# Patient Record
Sex: Male | Born: 1980 | Hispanic: No | Marital: Married | Smoking: Never smoker
Health system: Southern US, Community
[De-identification: ages and names within clinical notes are randomized; demographics above are authoritative.]

---

## 2020-04-04 DIAGNOSIS — Z111 Encounter for screening for respiratory tuberculosis: Secondary | ICD-10-CM | POA: Diagnosis not present

## 2020-04-04 DIAGNOSIS — Z23 Encounter for immunization: Secondary | ICD-10-CM | POA: Diagnosis not present

## 2020-05-05 DIAGNOSIS — Z23 Encounter for immunization: Secondary | ICD-10-CM | POA: Diagnosis not present

## 2020-05-19 DIAGNOSIS — K219 Gastro-esophageal reflux disease without esophagitis: Secondary | ICD-10-CM | POA: Diagnosis not present

## 2020-09-13 DIAGNOSIS — J209 Acute bronchitis, unspecified: Secondary | ICD-10-CM | POA: Diagnosis not present

## 2020-09-22 DIAGNOSIS — F439 Reaction to severe stress, unspecified: Secondary | ICD-10-CM | POA: Diagnosis not present

## 2020-10-20 DIAGNOSIS — F439 Reaction to severe stress, unspecified: Secondary | ICD-10-CM | POA: Diagnosis not present

## 2020-11-10 DIAGNOSIS — F439 Reaction to severe stress, unspecified: Secondary | ICD-10-CM | POA: Diagnosis not present

## 2020-11-27 DIAGNOSIS — Z23 Encounter for immunization: Secondary | ICD-10-CM | POA: Diagnosis not present

## 2020-12-08 DIAGNOSIS — M79671 Pain in right foot: Secondary | ICD-10-CM | POA: Diagnosis not present

## 2020-12-08 DIAGNOSIS — M79672 Pain in left foot: Secondary | ICD-10-CM | POA: Diagnosis not present

## 2020-12-22 DIAGNOSIS — F439 Reaction to severe stress, unspecified: Secondary | ICD-10-CM | POA: Diagnosis not present

## 2021-01-24 DIAGNOSIS — R221 Localized swelling, mass and lump, neck: Secondary | ICD-10-CM | POA: Diagnosis not present

## 2021-01-24 DIAGNOSIS — R946 Abnormal results of thyroid function studies: Secondary | ICD-10-CM | POA: Diagnosis not present

## 2021-01-24 DIAGNOSIS — F439 Reaction to severe stress, unspecified: Secondary | ICD-10-CM | POA: Diagnosis not present

## 2021-01-25 ENCOUNTER — Other Ambulatory Visit: Payer: Self-pay | Admitting: Adult Health Nurse Practitioner

## 2021-01-25 DIAGNOSIS — R221 Localized swelling, mass and lump, neck: Secondary | ICD-10-CM

## 2021-02-06 ENCOUNTER — Other Ambulatory Visit: Payer: Self-pay

## 2021-02-07 ENCOUNTER — Ambulatory Visit
Admission: RE | Admit: 2021-02-07 | Discharge: 2021-02-07 | Disposition: A | Discharge status: Home / Self Care | Payer: BC Managed Care – PPO | Source: Ambulatory Visit | Attending: Adult Health Nurse Practitioner | Admitting: Adult Health Nurse Practitioner

## 2021-02-07 DIAGNOSIS — E041 Nontoxic single thyroid nodule: Secondary | ICD-10-CM | POA: Diagnosis not present

## 2021-02-07 DIAGNOSIS — R221 Localized swelling, mass and lump, neck: Secondary | ICD-10-CM

## 2021-02-14 DIAGNOSIS — F439 Reaction to severe stress, unspecified: Secondary | ICD-10-CM | POA: Diagnosis not present

## 2021-02-23 ENCOUNTER — Encounter: Payer: Self-pay | Admitting: Internal Medicine

## 2021-02-23 ENCOUNTER — Ambulatory Visit: Payer: BC Managed Care – PPO | Admitting: Internal Medicine

## 2021-02-23 ENCOUNTER — Other Ambulatory Visit: Payer: Self-pay

## 2021-02-23 VITALS — BP 120/72 | HR 64 | Ht 66.0 in | Wt 148.0 lb

## 2021-02-23 DIAGNOSIS — E042 Nontoxic multinodular goiter: Secondary | ICD-10-CM | POA: Diagnosis not present

## 2021-02-23 DIAGNOSIS — E0590 Thyrotoxicosis, unspecified without thyrotoxic crisis or storm: Secondary | ICD-10-CM

## 2021-02-23 LAB — TSH: TSH: 0.38 u[IU]/mL (ref 0.35–5.50)

## 2021-02-23 LAB — T4, FREE: Free T4: 0.91 ng/dL (ref 0.60–1.60)

## 2021-02-23 NOTE — Progress Notes (Signed)
Name: Dustine Harrell  MRN/ DOB: [id number], 10/17/1980    Age/ Sex: 40 y.o., male    PCP: Pcp, No   Reason for Endocrinology Evaluation: MNG/subclinical hyperthyroidism     Date of Initial Endocrinology Evaluation: 02/23/2021     HPI: Mr. Maurice Harrell is a 40 y.o. male with a past medical history of MNG. The patient presented for initial endocrinology clinic visit on 02/23/2021 for consultative assistance with his MNG.   He presented to her PCP in 01/2021 with right neck mass, this prompted a thyroid ultrasound revealing right superior nodule meeting FNA criteria at 3.3x2.5x1.9 cm nodule and a left superior 1.3x1.1x0.6 cm nodule.    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 No pertinent past medical history. Past Surgical History: History reviewed. No pertinent surgical history.  Social History:  reports that he has never smoked. He has never used smokeless tobacco. He reports that he does not drink alcohol and does not use drugs. Family History: family history includes Hypertension in his mother.   HOME MEDICATIONS: Allergies as of 02/23/2021   No Known Allergies      Medication List        Accurate as of February 23, 2021 12:05 PM. If you have any questions, ask your nurse or doctor.          cetirizine 10 MG tablet Commonly known as: ZYRTEC Take 10 mg by mouth daily as needed.          REVIEW OF SYSTEMS: A comprehensive ROS was conducted with the patient and is negative except as per HPI    OBJECTIVE:  VS: BP 120/72   Pulse 64   Ht 5\' 6"  (1.676 m)   Wt 148 lb (67.1 kg)    SpO2 99%   BMI 23.89 kg/m    Wt Readings from Last 3 Encounters:  02/23/21 148 lb (67.1 kg)     EXAM: General: Pt appears well and is in NAD  Eyes: External eye exam normal without stare, lid lag or exophthalmos.    Neck: General: Supple without adenopathy. Thyroid: Thyroid size normal.  No goiter or nodules appreciated.   Lungs: Clear with good BS bilat with no rales, rhonchi, or wheezes  Heart: Auscultation: RRR.  Abdomen: Normoactive bowel sounds, soft, nontender, without masses or organomegaly palpable  Extremities:  BL LE: No pretibial edema normal ROM and strength.  Skin: Hair: Texture and amount normal with gender appropriate distribution Skin Inspection: No rashes Skin Palpation: Skin temperature, texture, and thickness normal to palpation  Neuro: Cranial nerves: II - XII grossly intact  Motor: Normal strength throughout DTRs: 2+ and symmetric in UE without delay in relaxation phase  Mental Status: Judgment, insight: Intact Orientation: Oriented to time, place, and person Mood and affect: No depression, anxiety, or agitation     DATA REVIEWED:  Results for Maurice, Harrell (MRN Rayburn Felt) as of 02/23/2021 15:16  Ref. Range 02/23/2021 12:12  TSH Latest Ref Range: 0.35 - 5.50 uIU/mL 0.38  T4,Free(Direct) Latest Ref Range: 0.60 -  1.60 ng/dL 5.46      Thyroid Ultrasound 02/07/2021    Estimated total number of nodules >/= 1 cm: 2   Number of spongiform nodules >/=  2 cm not described below (TR1): 0   Number of mixed cystic and solid nodules >/= 1.5 cm not described below (TR2): 0   _________________________________________________________   Nodule # 1:   Location: Right; Superior   Maximum size: 3.3 cm; Other 2 dimensions: 2.5 x 1.9 cm   Composition: solid/almost completely solid (2)   Echogenicity: hypoechoic (2)   Shape: not taller-than-wide (0)   Margins: smooth (0)   Echogenic foci: none (0)   ACR TI-RADS total points: 4.   ACR TI-RADS risk  category: TR4 (4-6 points).   ACR TI-RADS recommendations:   **Given size (>/= 1.5 cm) and appearance, fine needle aspiration of this moderately suspicious nodule should be considered based on TI-RADS criteria.   _________________________________________________________   Nodule # 2:   Location: Left; Superior   Maximum size: 1.3 cm; Other 2 dimensions: 1.1 x 0.6 cm   Composition: solid/almost completely solid (2)   Echogenicity: isoechoic (1)   Shape: not taller-than-wide (0)   Margins: smooth (0)   Echogenic foci: none (0)   ACR TI-RADS total points: 3.   ACR TI-RADS risk category: TR3 (3 points).   ACR TI-RADS recommendations:   Given size (<1.4 cm) and appearance, this nodule does NOT meet TI-RADS criteria for biopsy or dedicated follow-up.   _________________________________________________________   IMPRESSION: Dominant right superior solid thyroid nodule (labeled 1, 3.3 cm) meets criteria (TI-RADS category 4) for tissue sampling. Recommend ultrasound-guided fine-needle aspiration.    ASSESSMENT/PLAN/RECOMMENDATIONS:   MNG:  - He has not noted any increase in size in the past month - We discussed thyroid nodules carry a cancer risk of 3-5 % , will proceed with FNA of the right superior nodule   2. Subclinical Hyperthyroidism   - Pt is clinically euthyroid  -  Repeat TFT's are normal   F/U in 6 months     Signed electronically by: Lyndle Herrlich, MD  [facility] Endocrinology  [facility] [address] [place], [place] [address] Phone: [phone] FAX: [phone]   CC: Pcp, No No address on file Phone: None Fax: None   Return to Endocrinology clinic as below: No future appointments.

## 2021-02-25 LAB — T3: T3, Total: 132 ng/dL (ref 76–181)

## 2021-02-25 LAB — TRAB (TSH RECEPTOR BINDING ANTIBODY): TRAB: <1 IU/L (ref <=2.00)

## 2021-03-13 ENCOUNTER — Other Ambulatory Visit: Payer: Self-pay

## 2021-03-13 ENCOUNTER — Ambulatory Visit
Admission: RE | Admit: 2021-03-13 | Discharge: 2021-03-13 | Disposition: A | Discharge status: Home / Self Care | Payer: BC Managed Care – PPO | Source: Ambulatory Visit | Attending: Internal Medicine | Admitting: Internal Medicine

## 2021-03-13 ENCOUNTER — Other Ambulatory Visit (HOSPITAL_COMMUNITY)
Admission: RE | Admit: 2021-03-13 | Discharge: 2021-03-13 | Disposition: A | Discharge status: Home / Self Care | Payer: BC Managed Care – PPO | Source: Ambulatory Visit | Attending: Student | Admitting: Student

## 2021-03-13 DIAGNOSIS — E042 Nontoxic multinodular goiter: Secondary | ICD-10-CM

## 2021-03-13 DIAGNOSIS — E041 Nontoxic single thyroid nodule: Secondary | ICD-10-CM | POA: Diagnosis not present

## 2021-03-15 LAB — CYTOLOGY - NON PAP

## 2021-03-19 ENCOUNTER — Telehealth: Payer: Self-pay | Admitting: Internal Medicine

## 2021-03-19 ENCOUNTER — Encounter: Payer: Self-pay | Admitting: Internal Medicine

## 2021-03-19 DIAGNOSIS — E042 Nontoxic multinodular goiter: Secondary | ICD-10-CM

## 2021-03-19 NOTE — Telephone Encounter (Signed)
Discussed inconclusive FNA results of the right thyroid nodule due to scant cellularity     Pt in agreement to proceed with another FNA in 3 months

## 2021-06-27 ENCOUNTER — Ambulatory Visit: Payer: BC Managed Care – PPO | Admitting: Internal Medicine

## 2021-06-27 NOTE — Progress Notes (Deleted)
Name: Maurice Harrell  MRN/ DOB: [id number], 11/15/1980    Age/ Sex: 40 y.o., male     PCP: Pcp, No   Reason for Endocrinology Evaluation: MNG     Initial Endocrinology Clinic Visit: 02/23/2021    PATIENT IDENTIFIER: Maurice Harrell is a 40 y.o., male with a past medical history of MNG. He has followed with [facility] since 02/23/2021 for consultative assistance with management of his MNG.   HISTORICAL SUMMARY:   He presented to her PCP in 01/2021 with right neck mass, this prompted a thyroid ultrasound revealing right superior nodule meeting FNA criteria at 3.3x2.5x1.9 cm nodule and a left superior 1.3x1.1x0.6 cm nodule.      Of note, he was noted to have a low TSh at 0.26 uIU/mL with normal total T4 at 8.8 ( reference 4.9-10.5), but this has normalized .    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    HISTORY:  Past Medical History: No past medical history on file. Past Surgical History: No past surgical history on file. Social History:  reports that he has never smoked. He has never used smokeless tobacco. He reports that he does not drink alcohol and does not use drugs. Family History:  Family History  Problem Relation Age of Onset   Hypertension Mother      HOME MEDICATIONS: Allergies as of 06/27/2021   No Known Allergies      Medication List        Accurate as of June 27, 2021  7:16 AM. If you have any questions, ask your nurse or doctor.          cetirizine 10 MG tablet Commonly known as: ZYRTEC Take 10 mg by mouth daily as needed.          OBJECTIVE:   PHYSICAL EXAM: VS: There were no vitals taken for this visit.   EXAM: General: Pt appears well and is in NAD  Hydration: Well-hydrated with moist mucous membranes and good skin turgor  Eyes: External eye exam normal without stare, lid  lag or exophthalmos.  EOM intact.  PERRL.  Ears, Nose, Throat: Hearing: Grossly intact bilaterally Dental: Good dentition  Throat: Clear without mass, erythema or exudate  Neck: General: Supple without adenopathy. Thyroid: Thyroid size normal.  No goiter or nodules appreciated. No thyroid bruit.  Lungs: Clear with good BS bilat with no rales, rhonchi, or wheezes  Heart: Auscultation: RRR.  Abdomen: Normoactive bowel sounds, soft, nontender, without masses or organomegaly palpable  Extremities: Gait and station: Normal gait  Digits and nails: No clubbing, cyanosis, petechiae, or nodes Head and neck: Normal alignment and mobility BL UE: Normal ROM and strength. BL LE: No pretibial edema normal ROM and strength.  Mental Status: Judgment, insight: Intact Orientation: Oriented to time, place, and person Memory: Intact for recent and remote events Mood and affect: No depression, anxiety, or agitation     DATA REVIEWED: ***  FNA 03/13/2021   Clinical History: Right Superior, 3.3 cm; Other 2 dimensions: 2.5 x 1.9  cm, Solid/almost completely solid, Hypoechoic, TI-RADS total points:4  Specimen Submitted:  A. THYROID, RUP, FINE NEEDLE ASPIRATION:    FINAL MICROSCOPIC DIAGNOSIS:  - Scant follicular epithelium present (Bethesda category I)   ASSESSMENT / PLAN / RECOMMENDATIONS:   MNG:   Plan: ***  Medications   ***   Signed electronically by: Lyndle Herrlich, MD  [facility] Endocrinology  [facility] [address] [place], [place] [address] Phone: [phone] FAX: [phone]      CC: Pcp, No No address on file Phone: None  Fax: None   Return to Endocrinology clinic as below: Future Appointments  Date Time Provider Department Center  06/27/2021  9:10 AM Mauriana Dann, Konrad Dolores, MD [id number] None

## 2021-12-10 IMAGING — US US THYROID
1 series · 13 of 25 positions shown · non-contrast
Comparison: None.

CLINICAL DATA: Palpable abnormality.

EXAM:
THYROID ULTRASOUND
TECHNIQUE: Ultrasound examination of the thyroid gland and adjacent soft
tissues was performed.

[Series 1: us thyroid · 0.07mm/px · 13 of 70 slices shown]
[im 1/70]
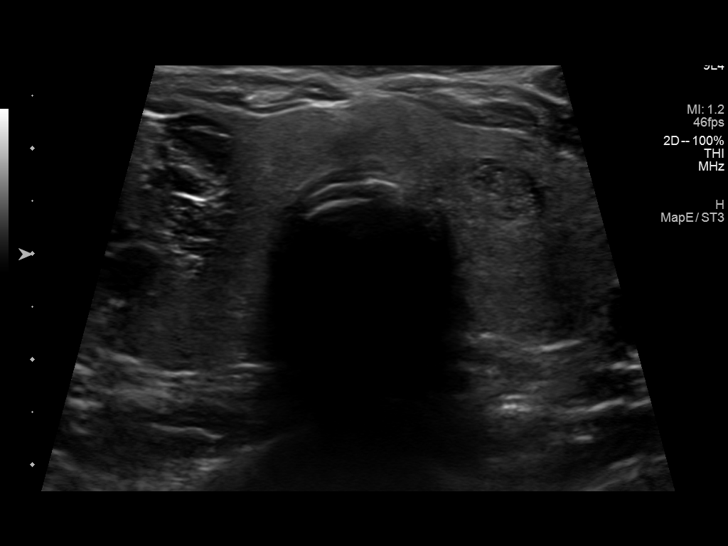
[im 6/70]
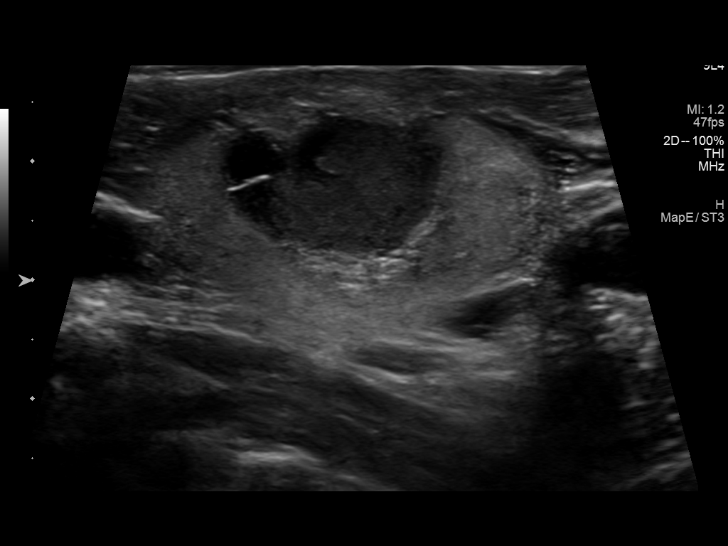
[im 12/70]
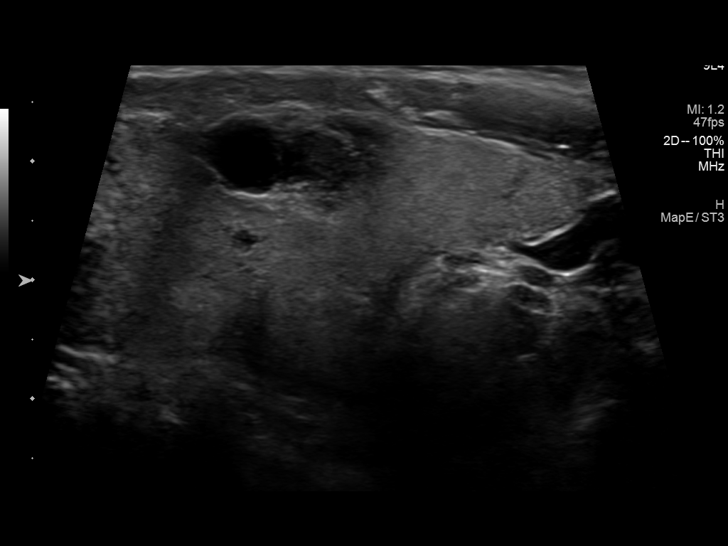
[im 18/70]
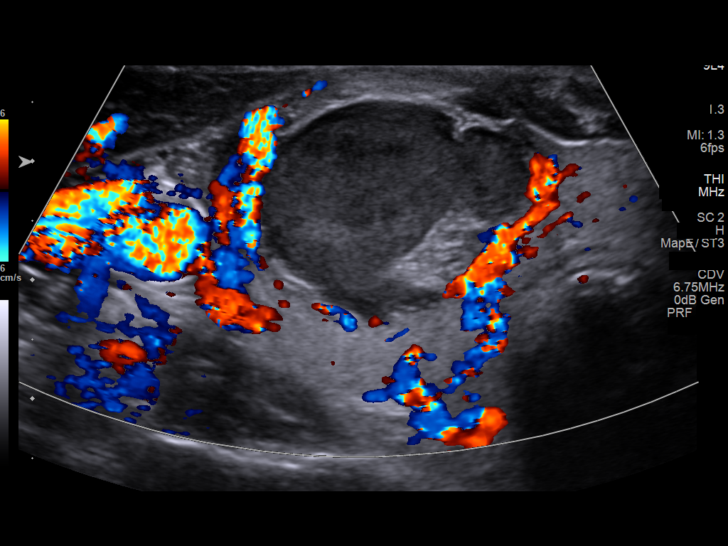
[im 24/70]
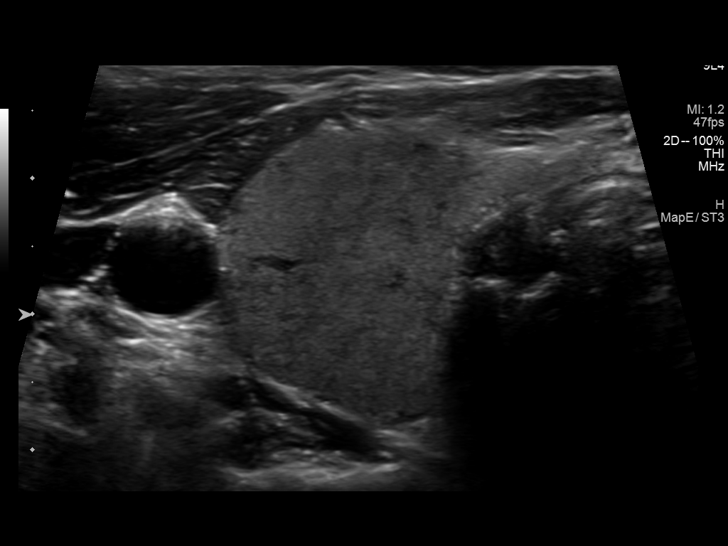
[im 29/70]
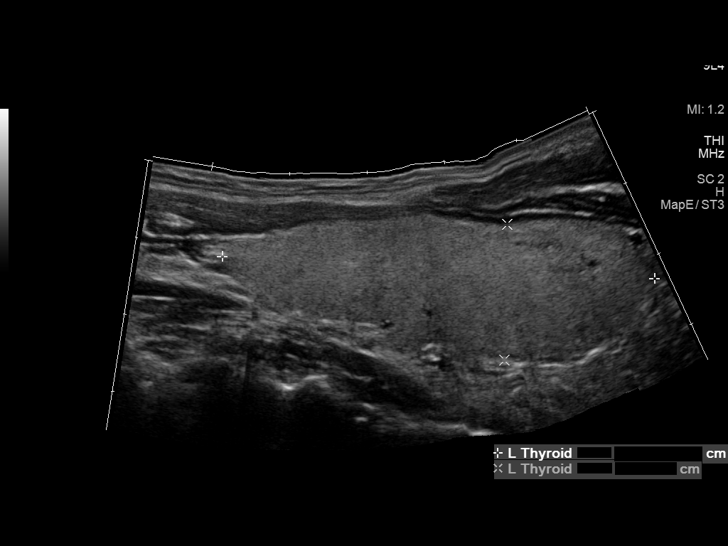
[im 35/70]
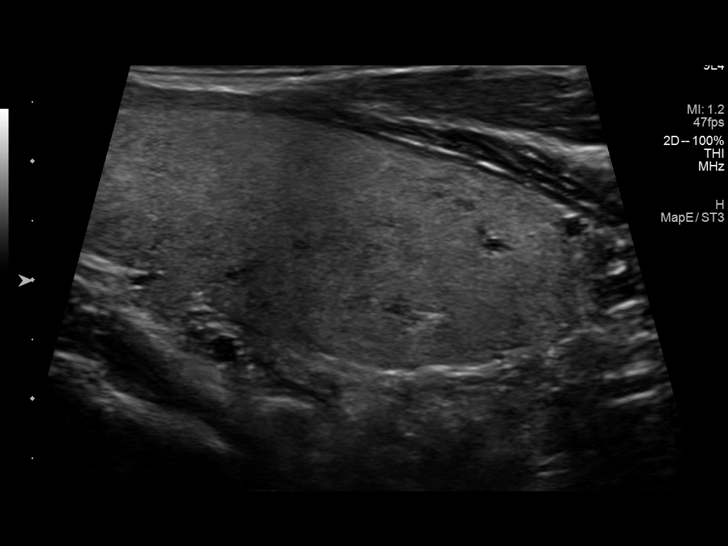
[im 41/70]
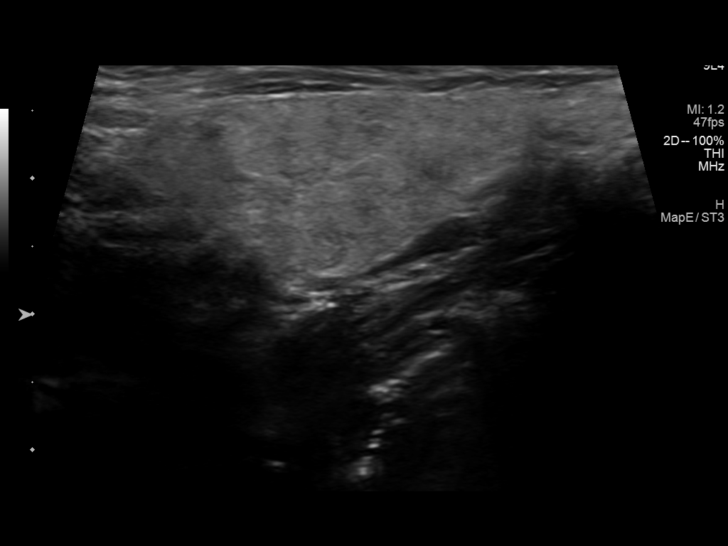
[im 47/70]
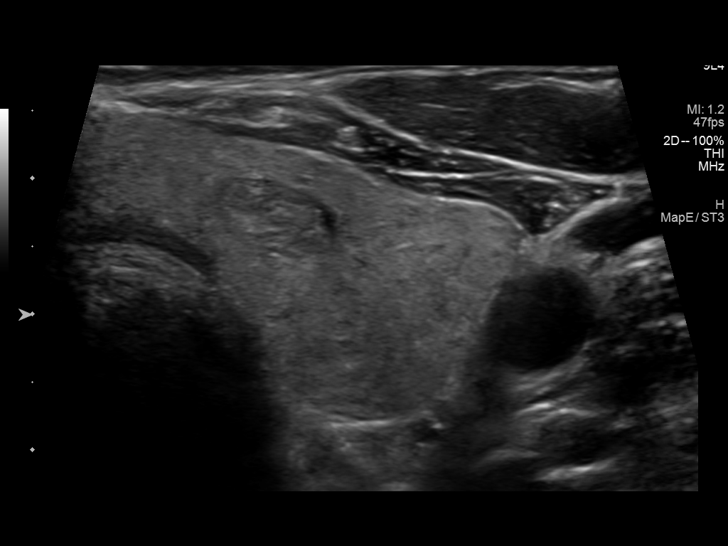
[im 52/70]
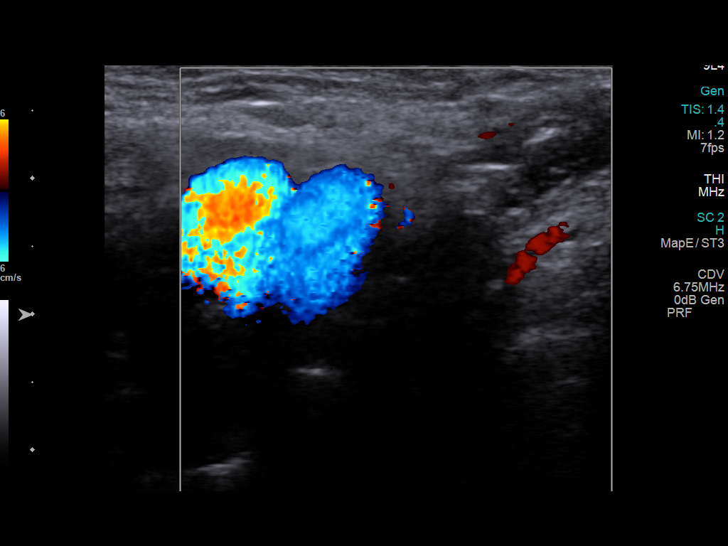
[im 58/70]
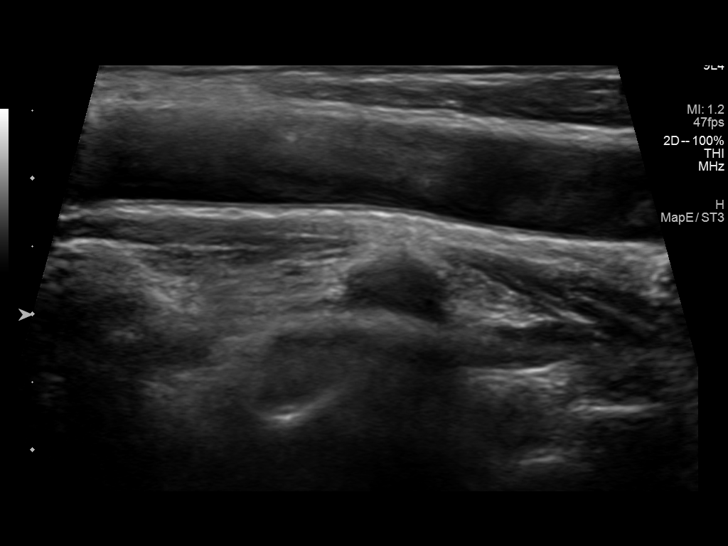
[im 64/70]
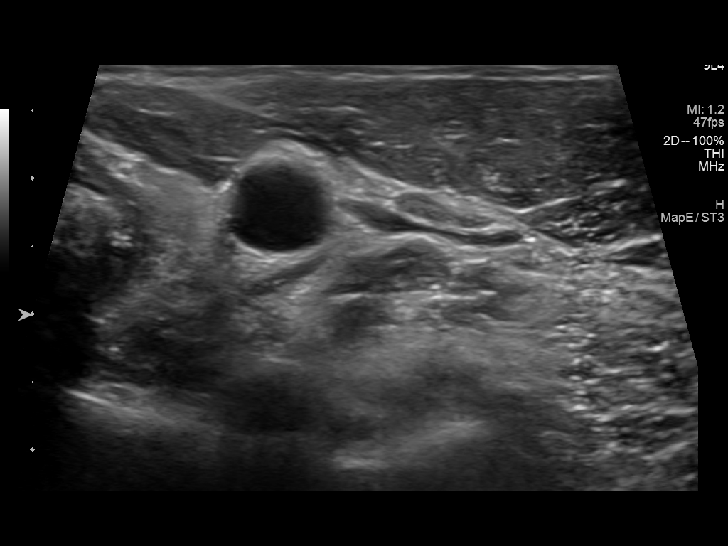
[im 70/70]
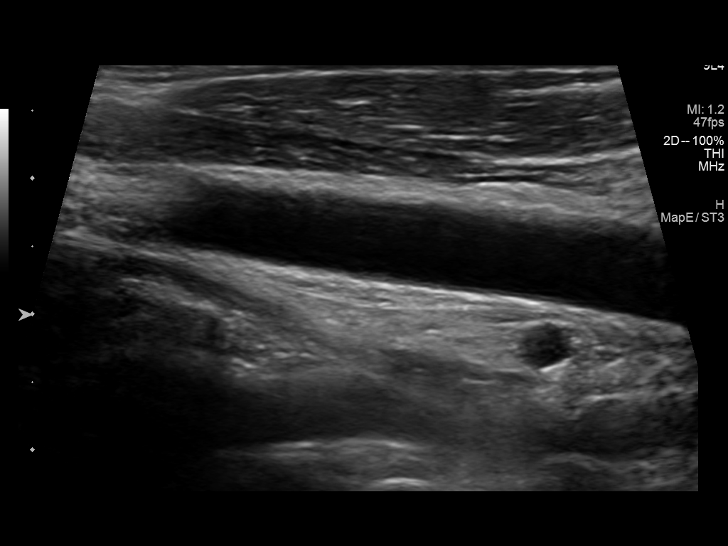

[13 of 25 positions shown; findings below may reference images not displayed]

FINDINGS: Parenchymal Echotexture: Mildly heterogenous

Isthmus: 0.7 cm

Right lobe: 6.4 x 2.7 x 3.2 cm

Left lobe: 5.6 x 1.7 x 2.1 cm

_________________________________________________________

Estimated total number of nodules >/= 1 cm: 2

Number of spongiform nodules >/=  2 cm not described below (TR1): 0

Number of mixed cystic and solid nodules >/= 1.5 cm not described
below (TR2): 0

_________________________________________________________

Nodule # 1:

Location: Right; Superior

Maximum size: 3.3 cm; Other 2 dimensions: 2.5 x 1.9 cm

Composition: solid/almost completely solid (2)

Echogenicity: hypoechoic (2)

Shape: not taller-than-wide (0)

Margins: smooth (0)

Echogenic foci: none (0)

ACR TI-RADS total points: 4.

ACR TI-RADS risk category: TR4 (4-6 points).

ACR TI-RADS recommendations:

**Given size (>/= 1.5 cm) and appearance, fine needle aspiration of
this moderately suspicious nodule should be considered based on
TI-RADS criteria.

_________________________________________________________

Nodule # 2:

Location: Left; Superior

Maximum size: 1.3 cm; Other 2 dimensions: 1.1 x 0.6 cm

Composition: solid/almost completely solid (2)

Echogenicity: isoechoic (1)

Shape: not taller-than-wide (0)

Margins: smooth (0)

Echogenic foci: none (0)

ACR TI-RADS total points: 3.

ACR TI-RADS risk category: TR3 (3 points).

ACR TI-RADS recommendations:

Given size (<1.4 cm) and appearance, this nodule does NOT meet
TI-RADS criteria for biopsy or dedicated follow-up.

_________________________________________________________
IMPRESSION: Dominant right superior solid thyroid nodule (labeled 1, 3.3 cm)
meets criteria (TI-RADS category 4) for tissue sampling. Recommend
ultrasound-guided fine-needle aspiration.

The above is in keeping with the ACR TI-RADS recommendations - [HOSPITAL] 6001;[DATE].

## 2022-01-13 IMAGING — US US FNA BIOPSY THYROID 1ST LESION
1 series · 12 of 12 positions shown · non-contrast
Comparison: Thyroid US on 02/07/2021

MEDICATIONS:
3 ML 1 % lidocaine

COMPLICATIONS:
None immediate.

INDICATION: Indeterminate thyroid nodule in the right superior lobe.

EXAM:
ULTRASOUND GUIDED FINE NEEDLE ASPIRATION OF INDETERMINATE THYROID
NODULE
TECHNIQUE: Informed written consent was obtained from the patient after a
discussion of the risks, benefits and alternatives to treatment.
Questions regarding the procedure were encouraged and answered. A
timeout was performed prior to the initiation of the procedure.

[Series 1: us fna biopsy thyroid 1st lesion · 0.06mm/px · 12 acquisitions, 12 frames shown]
[im 1/12]
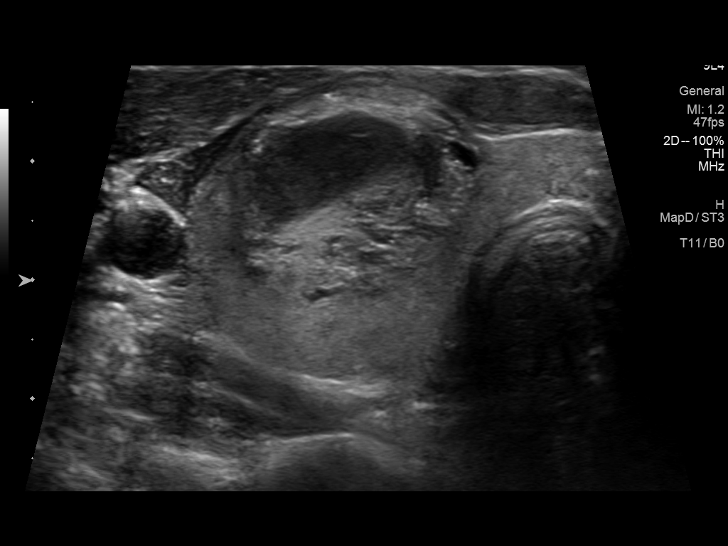
[im 2/12]
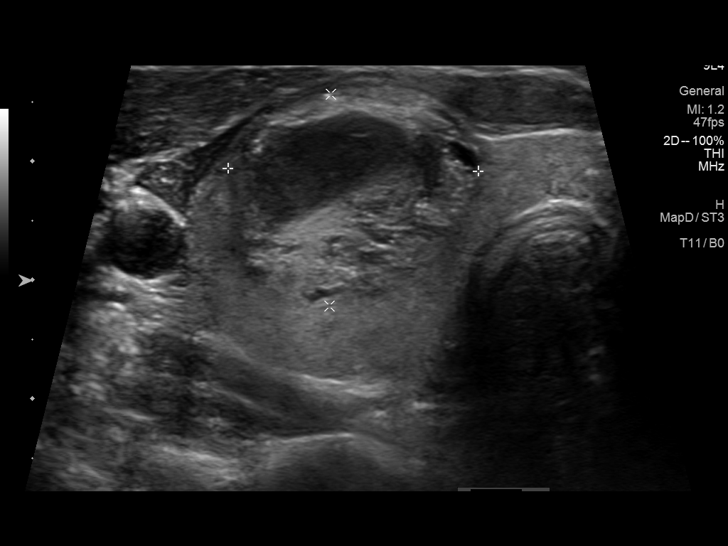
[im 3/12]
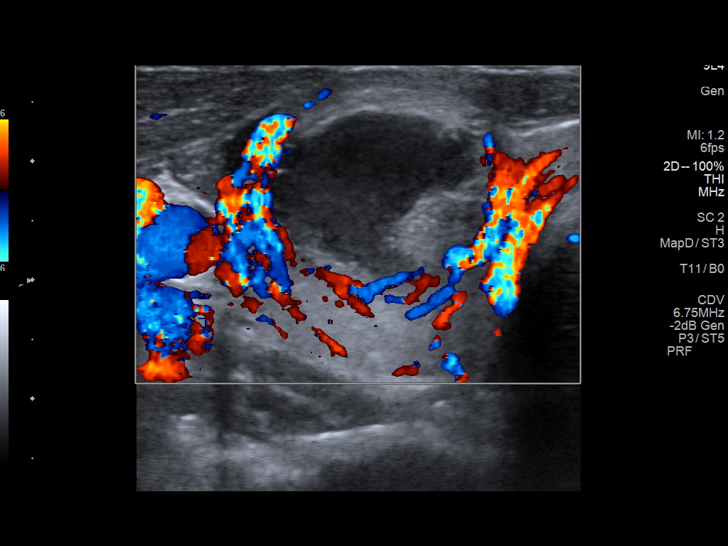
[im 4/12]
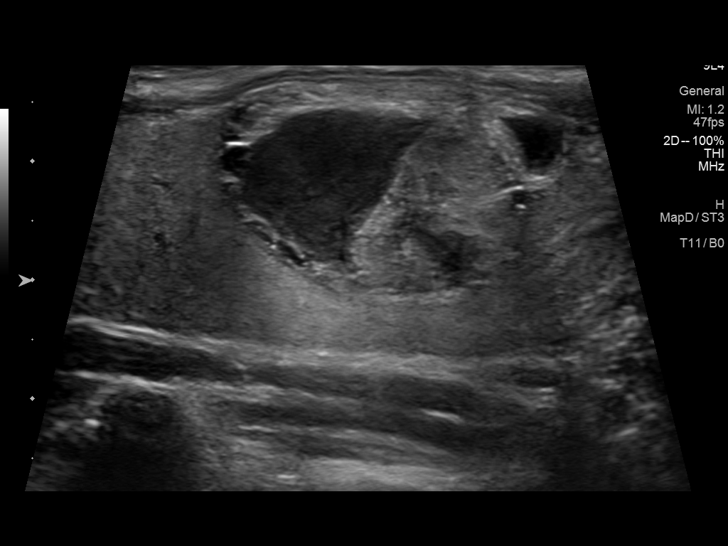
[im 5/12]
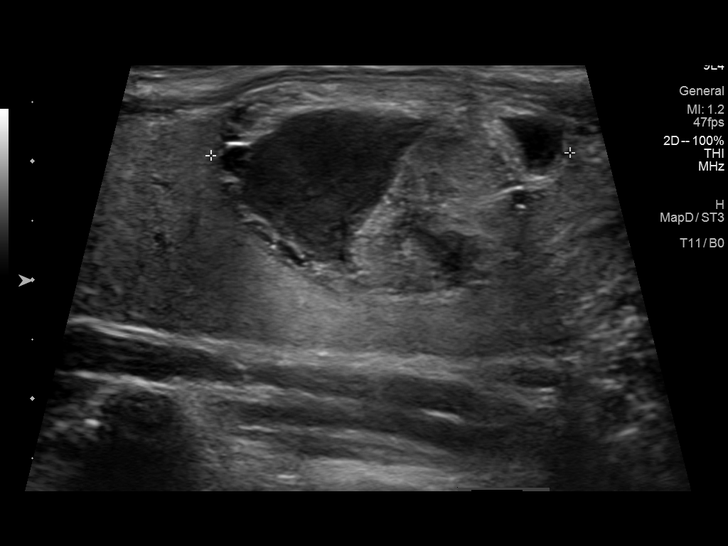
[im 6/12]
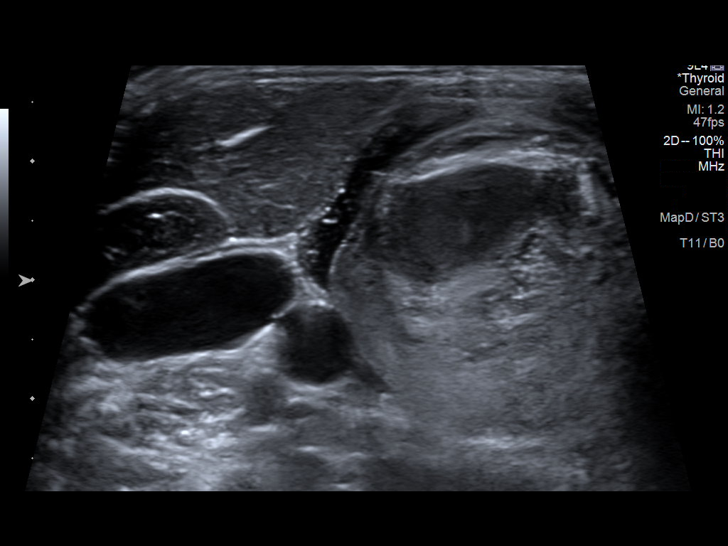
[im 7/12]
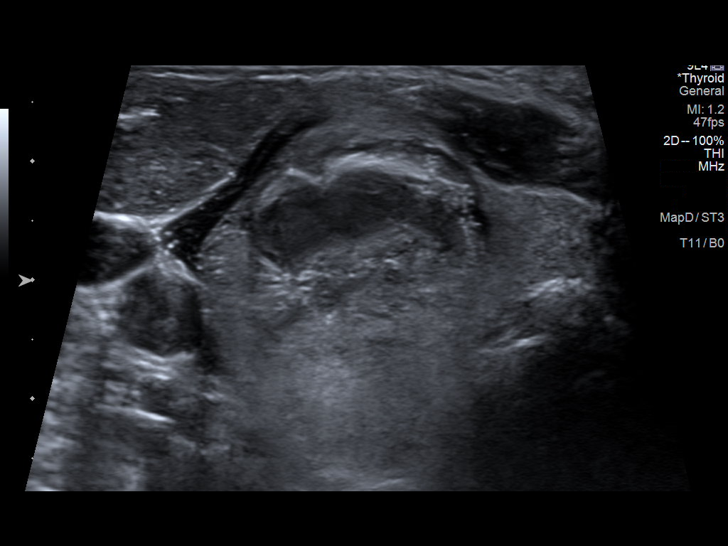
[im 8/12]
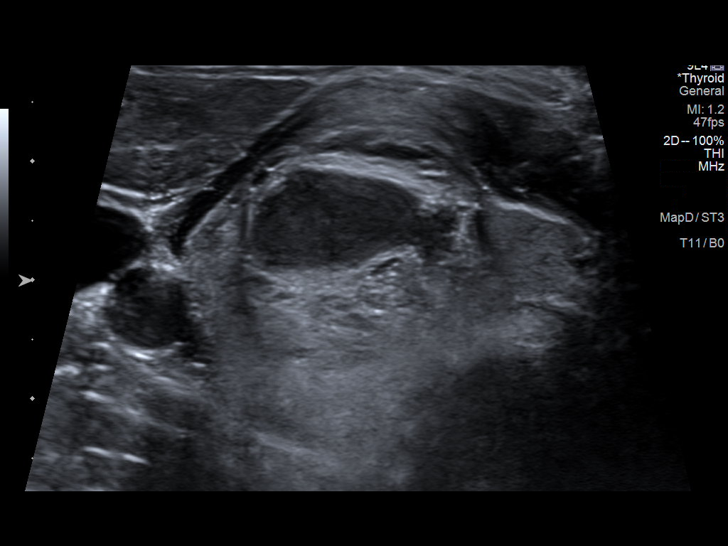
[im 9/12]
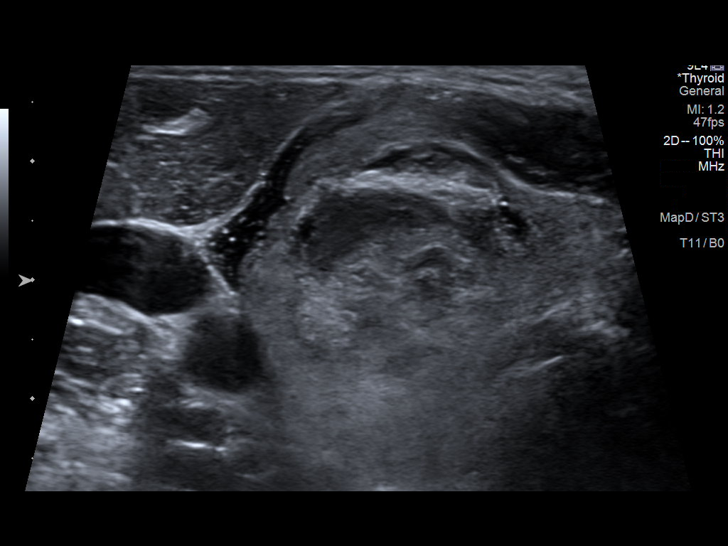
[im 10/12]
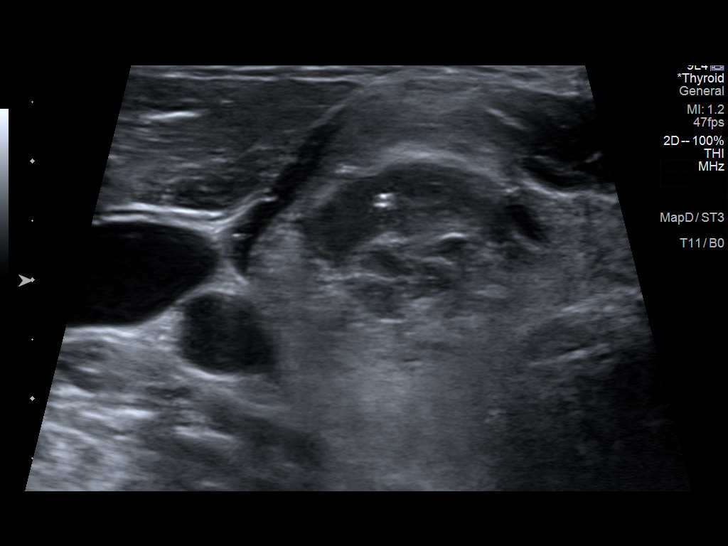
[im 11/12]
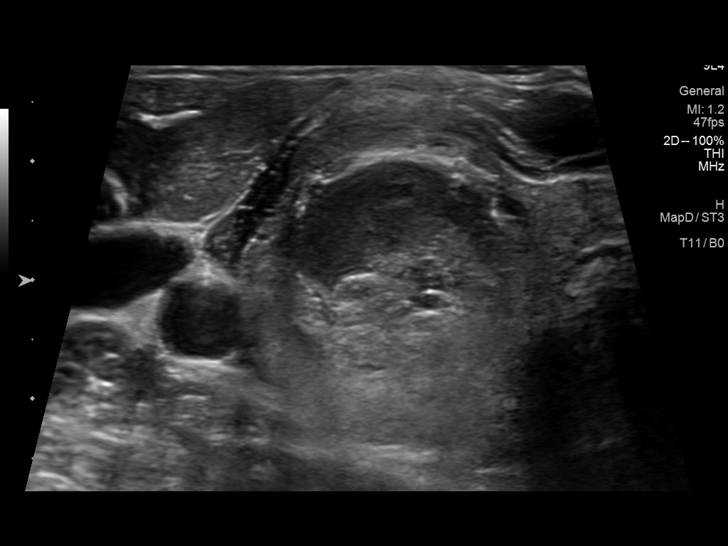
[im 12/12]
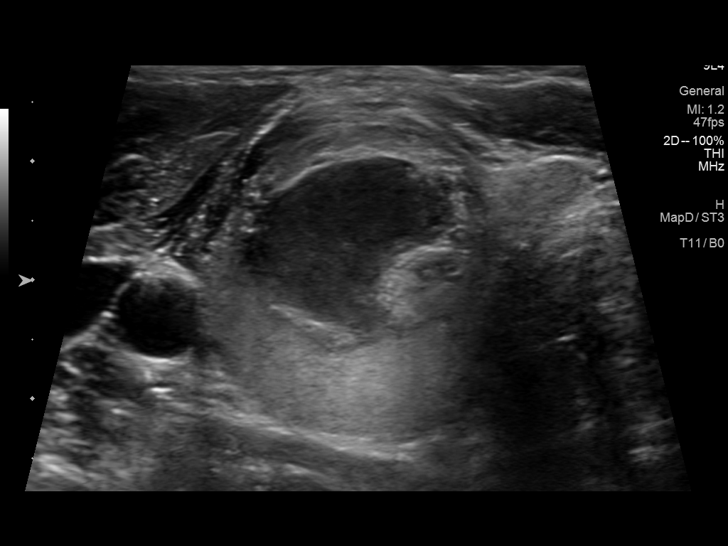

[12 of 12 positions shown; findings below may reference images not displayed]

Pre-procedural ultrasound scanning demonstrated unchanged size and
appearance of the indeterminate nodule within the right superior
thyroid lobe.

The procedure was planned. The neck was prepped in the usual sterile
fashion, and a sterile drape was applied covering the operative
field. A timeout was performed prior to the initiation of the
procedure. Local anesthesia was provided with 1% lidocaine.

Under direct ultrasound guidance, 5 FNA biopsies were performed of
the right superior thyroid nodule with a 27 gauge needle. Two of
these samples were obtained for AFIRMA. Multiple ultrasound images
were saved for procedural documentation purposes. The samples were
prepared and submitted to pathology.

Limited post procedural scanning was negative for hematoma or
additional complication. Dressings were placed. The patient
tolerated the above procedures procedure well without immediate
postprocedural complication.
FINDINGS: Nodule reference number based on prior diagnostic ultrasound: 1

Maximum size: 3.3 cm

Location: Right; Superior

ACR TI-RADS risk category: TR4 (4-6 points)

Reason for biopsy: meets ACR TI-RADS criteria

Ultrasound imaging confirms appropriate placement of the needles
within the thyroid nodule.
IMPRESSION: Technically successful ultrasound guided fine needle aspiration of
the right superior thyroid nodule.

## 2022-01-17 DIAGNOSIS — F439 Reaction to severe stress, unspecified: Secondary | ICD-10-CM | POA: Diagnosis not present

## 2022-01-24 DIAGNOSIS — F439 Reaction to severe stress, unspecified: Secondary | ICD-10-CM | POA: Diagnosis not present

## 2022-03-11 DIAGNOSIS — R220 Localized swelling, mass and lump, head: Secondary | ICD-10-CM | POA: Diagnosis not present

## 2022-03-11 DIAGNOSIS — E039 Hypothyroidism, unspecified: Secondary | ICD-10-CM | POA: Diagnosis not present

## 2022-03-14 ENCOUNTER — Other Ambulatory Visit: Payer: Self-pay | Admitting: Internal Medicine

## 2022-03-14 DIAGNOSIS — R221 Localized swelling, mass and lump, neck: Secondary | ICD-10-CM

## 2022-03-14 DIAGNOSIS — E039 Hypothyroidism, unspecified: Secondary | ICD-10-CM

## 2022-03-21 ENCOUNTER — Other Ambulatory Visit: Payer: BC Managed Care – PPO
